# Patient Record
Sex: Female | Born: 1945 | Race: Black or African American | Hispanic: No | Marital: Single | Smoking: Former smoker
Health system: Southern US, Community
[De-identification: ages and names within clinical notes are randomized; demographics above are authoritative.]

## PROBLEM LIST (undated history)

## (undated) DIAGNOSIS — I5040 Unspecified combined systolic (congestive) and diastolic (congestive) heart failure: Secondary | ICD-10-CM

## (undated) DIAGNOSIS — Z950 Presence of cardiac pacemaker: Secondary | ICD-10-CM

## (undated) DIAGNOSIS — K219 Gastro-esophageal reflux disease without esophagitis: Secondary | ICD-10-CM

## (undated) DIAGNOSIS — I10 Essential (primary) hypertension: Secondary | ICD-10-CM

## (undated) DIAGNOSIS — E1121 Type 2 diabetes mellitus with diabetic nephropathy: Secondary | ICD-10-CM

## (undated) DIAGNOSIS — N649 Disorder of breast, unspecified: Secondary | ICD-10-CM

## (undated) DIAGNOSIS — M1990 Unspecified osteoarthritis, unspecified site: Secondary | ICD-10-CM

## (undated) DIAGNOSIS — E039 Hypothyroidism, unspecified: Secondary | ICD-10-CM

## (undated) DIAGNOSIS — H269 Unspecified cataract: Secondary | ICD-10-CM

## (undated) DIAGNOSIS — N2581 Secondary hyperparathyroidism of renal origin: Secondary | ICD-10-CM

## (undated) DIAGNOSIS — I700 Atherosclerosis of aorta: Secondary | ICD-10-CM

## (undated) DIAGNOSIS — I452 Bifascicular block: Secondary | ICD-10-CM

## (undated) DIAGNOSIS — I429 Cardiomyopathy, unspecified: Secondary | ICD-10-CM

## (undated) DIAGNOSIS — E785 Hyperlipidemia, unspecified: Secondary | ICD-10-CM

## (undated) HISTORY — PX: PACEMAKER INSERTION: SHX728

## (undated) HISTORY — DX: Bifascicular block: I45.2

## (undated) HISTORY — PX: TONSILLECTOMY: SUR1361

## (undated) HISTORY — DX: Unspecified cataract: H26.9

## (undated) HISTORY — PX: X-STOP IMPLANTATION: SHX2677

## (undated) HISTORY — DX: Unspecified combined systolic (congestive) and diastolic (congestive) heart failure: I50.40

## (undated) HISTORY — DX: Atherosclerosis of aorta: I70.0

## (undated) HISTORY — PX: CHOLECYSTECTOMY: SHX55

## (undated) HISTORY — DX: Presence of cardiac pacemaker: Z95.0

## (undated) HISTORY — DX: Gastro-esophageal reflux disease without esophagitis: K21.9

## (undated) HISTORY — PX: ABDOMINAL HYSTERECTOMY: SUR658

## (undated) HISTORY — DX: Hyperlipidemia, unspecified: E78.5

## (undated) HISTORY — DX: Secondary hyperparathyroidism of renal origin: N25.81

## (undated) HISTORY — DX: Unspecified osteoarthritis, unspecified site: M19.90

## (undated) HISTORY — DX: Disorder of breast, unspecified: N64.9

## (undated) HISTORY — DX: Cardiomyopathy, unspecified: I42.9

## (undated) HISTORY — DX: Type 2 diabetes mellitus with diabetic nephropathy: E11.21

## (undated) HISTORY — DX: Hypothyroidism, unspecified: E03.9

## (undated) HISTORY — DX: Essential (primary) hypertension: I10

---

## 2018-12-13 ENCOUNTER — Other Ambulatory Visit: Payer: Self-pay | Admitting: Family Medicine

## 2018-12-13 DIAGNOSIS — N6320 Unspecified lump in the left breast, unspecified quadrant: Secondary | ICD-10-CM

## 2019-01-26 ENCOUNTER — Telehealth: Payer: Self-pay

## 2019-01-26 NOTE — Telephone Encounter (Signed)
NOTES FROM DR SUN [phone] REFERRAL  IN PROFICIENT

## 2019-02-23 ENCOUNTER — Telehealth: Payer: Self-pay | Admitting: *Deleted

## 2019-02-23 NOTE — Telephone Encounter (Signed)
Spoke with patient to determine ICD vendor in anticipation of appointment on Friday. Pt describes a Medtronic monitor. She is aware of appointment to establish care with Dr. Caryl Comes on 02/25/19 at 3:30pm. No questions at this time.

## 2019-02-24 ENCOUNTER — Ambulatory Visit: Payer: Self-pay | Admitting: Cardiovascular Disease

## 2019-02-25 ENCOUNTER — Other Ambulatory Visit: Payer: Self-pay

## 2019-02-25 ENCOUNTER — Encounter: Payer: Self-pay | Admitting: Internal Medicine

## 2019-02-25 ENCOUNTER — Ambulatory Visit (INDEPENDENT_AMBULATORY_CARE_PROVIDER_SITE_OTHER): Payer: Medicare Other | Admitting: Internal Medicine

## 2019-02-25 VITALS — BP 126/68 | HR 56 | Ht 64.0 in | Wt 337.0 lb

## 2019-02-25 DIAGNOSIS — I493 Ventricular premature depolarization: Secondary | ICD-10-CM

## 2019-02-25 DIAGNOSIS — I429 Cardiomyopathy, unspecified: Secondary | ICD-10-CM

## 2019-02-25 NOTE — Patient Instructions (Addendum)
Medication Instructions:  Your physician recommends that you continue on your current medications as directed. Please refer to the Current Medication list given to you today.  Labwork: Your physician recommends that you return for lab work in: BMP and CBC   Testing/Procedures: Your physician has requested that you have an echocardiogram. Echocardiography is a painless test that uses sound waves to create images of your heart. It provides your doctor with information about the size and shape of your heart and how well your heart's chambers and valves are working. This procedure takes approximately one hour. There are no restrictions for this procedure.  Your physician has recommended that you wear an event monitor. Event monitors are medical devices that record the heart's electrical activity. Doctors most often [place] these monitors to diagnose arrhythmias. Arrhythmias are problems with the speed or rhythm of the heartbeat. The monitor is a small, portable device. You can wear one while you do your normal daily activities. This is usually used to diagnose what is causing palpitations/syncope (passing out).   Follow-Up: Your physician recommends that you schedule a follow-up appointment in:   December 2 @ 2pm with Dr. Caryl Comes  Any Other Special Instructions Will Be Listed Below (If Applicable).     If you need a refill on your cardiac medications before your next appointment, please call your pharmacy.

## 2019-02-25 NOTE — Progress Notes (Signed)
ELECTROPHYSIOLOGY CONSULT NOTE  Patient ID: Lori Warner, MRN: [id number], DOB/AGE: 71-Jul-1947 71 y.o. Admit date: (Not on file) Date of Consult: 02/25/2019  Primary Physician: Deatra JamesSun, Vyvyan, MD Primary Cardiologist: new  Previously Lori Warner is a 73 y.o. female who is being seen today for the evaluation of ICD and CM at the request of Dr Wynelle LinkSun.   Chief Complaint: ICD   HPI Lori Warner is a 73 y.o. female with a history of nonischemic cardiomyopathy by report for which he underwent ICD implantation for primary prevention about 4 years ago.  She has chronic modest dyspnea.  She is limited to less than 100 yards.  She has treated sleep apnea and does not have orthopnea or nocturnal dyspnea.  She has mild edema.  She has had no syncope.  Her ICD has never gone off.   Past Medical History:  Diagnosis Date  . Aortic atherosclerosis (HCC)   . Arthritis   . Breast lesion   . Cardiomyopathy (HCC)   . Cataracts, bilateral   . Combined congestive systolic and diastolic heart failure (HCC)   . Diabetes mellitus with nephropathy (HCC)   . GERD (gastroesophageal reflux disease)   . Hyperlipidemia   . Hypertension   . Hypothyroidism   . Pacemaker   . Right bundle branch block (RBBB) with left anterior fascicular block (LAFB)   . Secondary hyperparathyroidism [facility](HCC)       Surgical History:  Past Surgical History:  Procedure Laterality Date  . ABDOMINAL HYSTERECTOMY    . CHOLECYSTECTOMY    . PACEMAKER INSERTION    . TONSILLECTOMY    . X-STOP IMPLANTATION       Home Meds: Prior to Admission medications   Medication Sig Start Date End Date Taking? Authorizing Provider  aspirin EC 81 MG tablet Take 81 mg by mouth daily.   Yes [provider]  calcitRIOL (ROCALTROL) 0.25 MCG capsule Take 0.25 mcg by mouth daily.   Yes [provider]  carvedilol (COREG) 25 MG tablet Take 25 mg by mouth 2 (two) times daily with a meal.   Yes [provider]  DULoxetine (CYMBALTA) 60 MG capsule Take 60 mg by mouth daily.   Yes [provider]  famotidine (PEPCID) 20 MG tablet Take 20 mg by mouth 2 (two) times daily.   Yes [provider]  gabapentin (NEURONTIN) 600 MG tablet Take 600 mg by mouth 3 (three) times daily.   Yes [provider]  insulin NPH-regular Human (HUMULIN 70/30) (70-30) 100 UNIT/ML injection Inject 44 Units into the skin daily.    Yes [provider]  levothyroxine (SYNTHROID) 200 MCG tablet Take 200 mcg by mouth daily before breakfast.   Yes [provider]  losartan (COZAAR) 25 MG tablet Take 25 mg by mouth daily.   Yes [provider]  montelukast (SINGULAIR) 10 MG tablet Take 10 mg by mouth at bedtime.   Yes [provider]  Multiple Vitamin (MULTIVITAMIN) tablet Take 1 tablet by mouth daily.   Yes [provider]  torsemide (DEMADEX) 20 MG tablet Take 40 mg by mouth daily.   Yes [provider]      Allergies: No Known Allergies  Social History   Socioeconomic History  . Marital status: Single    Spouse name: Not on file  . Number of children: Not on file  . Years of education: Not on file  . Highest education level: Not on file  Occupational History  .  Not on file  Social Needs  . Financial resource strain: Not on file  . Food insecurity    Worry: Not on file    Inability: Not on file  . Transportation needs    Medical: Not on file    Non-medical: Not on file  Tobacco Use  . Smoking status: Former Smoker    Years: 40.00    Types: Cigarettes  . Smokeless tobacco: Never Used  Substance and Sexual Activity  . Alcohol use: Not on file  . Drug use: Not on file  . Sexual activity: Not Currently  Lifestyle  . Physical activity    Days per week: Not on file    Minutes per session: Not on file  . Stress: Not on file  Relationships  . Social Musician on phone: Not on file    Gets together: Not on  file    Attends religious service: Not on file    Active member of club or organization: Not on file    Attends meetings of clubs or organizations: Not on file    Relationship status: Not on file  . Intimate partner violence    Fear of current or ex partner: Not on file    Emotionally abused: Not on file    Physically abused: Not on file    Forced sexual activity: Not on file  Other Topics Concern  . Not on file  Social History Narrative  . Not on file     Family History  Problem Relation Age of Onset  . Hypertension Mother   . Hyperlipidemia Mother   . Cancer - Lung Mother   . Hypertension Father   . Hyperlipidemia Father   . Hypertension Brother   . Hyperlipidemia Brother   . Cancer - Lung Brother        MELANOMA     ROS:  Please see the history of present illness.     All other systems reviewed and negative.    Physical Exam: Blood pressure 126/68, pulse (!) 56, height 5\' 4"  (1.626 m), weight (!) 337 lb (152.9 kg), SpO2 92 %. General: Well developed, Morbidly obese AA  female in no acute distress. Head: Normocephalic, atraumatic, sclera non-icteric, no xanthomas, nares are without discharge. EENT: normal Lymph Nodes:  none Back: without scoliosis/kyphosis, no CVA tendersness Neck: Negative for carotid bruits. JVD not detectable. Lungs: Clear bilaterally to auscultation without wheezes, rales, or rhonchi. Breathing is unlabored. Heart: RRR with S1 S2. No murmur , rubs, or gallops appreciated. Abdomen: Soft, non-tender, non-distended with normoactive bowel sounds. No hepatomegaly. No rebound/guarding. No obvious abdominal masses. Msk:  Strength and tone appear normal for age. Extremities: No clubbing or cyanosis. tr edema.  Distal pedal pulses are 2+ and equal bilaterally. Skin: Warm and Dry Neuro: Alert and oriented X 3. CN III-XII intact Grossly normal sensory and motor function . Psych:  Responds to questions appropriately with a normal affect.       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  None of the primary data are available.  She underwent ICD implantation for primary prevention.  A monitoring strip shows irregularity of of normal complexes.  This raises the possibility of atrial  fibrillation.  We also note significant PVCs.  We will undertake a 72-hour ZIO patch monitor to quantitate her PVC burden.  We will undertake an echocardiogram to look at left ventricular function.  If it remains depressed, she would be a candidate for the initiation of Aldactone therapy as well as a transition of her losartan to [facility].  Not volume overloaded currently.  We will establish her remotes to be done locally.  We will check a metabolic profile and hemoglobin when she comes in for echocardiogram.  We will also need to get records.  Potentially we can get them from Dr. Lynnda Child office [place]

## 2019-03-01 ENCOUNTER — Ambulatory Visit
Admission: RE | Admit: 2019-03-01 | Discharge: 2019-03-01 | Disposition: A | Discharge status: Home / Self Care | Payer: Medicare Other | Source: Ambulatory Visit | Attending: Family Medicine | Admitting: Family Medicine

## 2019-03-01 ENCOUNTER — Other Ambulatory Visit: Payer: Self-pay | Admitting: Family Medicine

## 2019-03-01 ENCOUNTER — Other Ambulatory Visit: Payer: Self-pay

## 2019-03-01 DIAGNOSIS — N6320 Unspecified lump in the left breast, unspecified quadrant: Secondary | ICD-10-CM

## 2019-03-07 ENCOUNTER — Telehealth: Payer: Self-pay | Admitting: *Deleted

## 2019-03-07 NOTE — Telephone Encounter (Signed)
3 day ZIO XT long term holter monitor to be mailed to the patients home.  Instructions reviewed briefly as they are included in the monitor kit. 

## 2019-03-11 ENCOUNTER — Other Ambulatory Visit: Payer: Self-pay

## 2019-03-11 ENCOUNTER — Ambulatory Visit (INDEPENDENT_AMBULATORY_CARE_PROVIDER_SITE_OTHER): Payer: Medicare Other

## 2019-03-11 ENCOUNTER — Ambulatory Visit (HOSPITAL_COMMUNITY): Payer: Medicare Other | Attending: Cardiovascular Disease

## 2019-03-11 DIAGNOSIS — I5042 Chronic combined systolic (congestive) and diastolic (congestive) heart failure: Secondary | ICD-10-CM | POA: Insufficient documentation

## 2019-03-11 DIAGNOSIS — Z950 Presence of cardiac pacemaker: Secondary | ICD-10-CM | POA: Insufficient documentation

## 2019-03-11 DIAGNOSIS — I493 Ventricular premature depolarization: Secondary | ICD-10-CM

## 2019-03-11 DIAGNOSIS — I429 Cardiomyopathy, unspecified: Secondary | ICD-10-CM | POA: Diagnosis present

## 2019-03-11 DIAGNOSIS — Z87891 Personal history of nicotine dependence: Secondary | ICD-10-CM | POA: Insufficient documentation

## 2019-03-11 DIAGNOSIS — E785 Hyperlipidemia, unspecified: Secondary | ICD-10-CM | POA: Insufficient documentation

## 2019-03-11 DIAGNOSIS — I110 Hypertensive heart disease with heart failure: Secondary | ICD-10-CM | POA: Insufficient documentation

## 2019-03-11 DIAGNOSIS — E119 Type 2 diabetes mellitus without complications: Secondary | ICD-10-CM | POA: Insufficient documentation

## 2019-03-11 NOTE — Addendum Note (Signed)
Addended by: Eulis Foster on: 03/11/2019 12:42 PM   Modules accepted: Orders

## 2019-03-12 LAB — CBC
Hematocrit: 36.2 % (ref 34.0–46.6)
Hemoglobin: 12.4 g/dL (ref 11.1–15.9)
MCH: 28.8 pg (ref 26.6–33.0)
MCHC: 34.3 g/dL (ref 31.5–35.7)
MCV: 84 fL (ref 79–97)
Platelets: 197 10*3/uL (ref 150–450)
RBC: 4.3 x10E6/uL (ref 3.77–5.28)
RDW: 13.5 % (ref 11.7–15.4)
WBC: 6.4 10*3/uL (ref 3.4–10.8)

## 2019-03-12 LAB — BASIC METABOLIC PANEL
BUN/Creatinine Ratio: 17 (ref 12–28)
BUN: 25 mg/dL (ref 8–27)
CO2: 28 mmol/L (ref 20–29)
Calcium: 10.3 mg/dL (ref 8.7–10.3)
Chloride: 97 mmol/L (ref 96–106)
Creatinine, Ser: 1.45 mg/dL — ABNORMAL HIGH (ref 0.57–1.00)
GFR calc Af Amer: 42 mL/min/{1.73_m2} — ABNORMAL LOW (ref >59)
GFR calc non Af Amer: 37 mL/min/{1.73_m2} — ABNORMAL LOW (ref >59)
Glucose: 131 mg/dL — ABNORMAL HIGH (ref 65–99)
Potassium: 4.2 mmol/L (ref 3.5–5.2)
Sodium: 140 mmol/L (ref 134–144)

## 2019-03-22 ENCOUNTER — Telehealth: Payer: Self-pay

## 2019-03-22 MED ORDER — FUROSEMIDE 20 MG PO TABS: 20.0000 mg | ORAL_TABLET | ORAL | 3 refills | Status: DC

## 2019-03-22 NOTE — Telephone Encounter (Signed)
Pt is aware of echo results and recommendations. She states she does have SOB when moving about during her ADL's. She agrees to begin lasix 20mg  qod to see if this helps to relieve her sx. We discussed sodium and fluid management. She states she is sending in her monitor today for Dr. Caryl Comes to review her PVC burden as well. She had no additional questions at this time.

## 2019-03-22 NOTE — Telephone Encounter (Signed)
-----   Message from Deboraha Sprang, MD sent at 03/20/2019  2:45 PM EDT ----- Please Inform Patient Echo showed heart muscle function  EF 45-50%  For now would continue current meds   Lets try lasix 20 mg QOD and see if she gets lets SOB

## 2019-05-26 ENCOUNTER — Encounter: Payer: Medicare Other | Admitting: Internal Medicine

## 2019-07-10 DIAGNOSIS — Z95810 Presence of automatic (implantable) cardiac defibrillator: Secondary | ICD-10-CM

## 2019-07-10 DIAGNOSIS — I509 Heart failure, unspecified: Secondary | ICD-10-CM

## 2019-07-10 DIAGNOSIS — I493 Ventricular premature depolarization: Secondary | ICD-10-CM

## 2019-07-10 DIAGNOSIS — I452 Bifascicular block: Secondary | ICD-10-CM

## 2019-07-10 DIAGNOSIS — I428 Other cardiomyopathies: Secondary | ICD-10-CM

## 2019-07-10 HISTORY — DX: Ventricular premature depolarization: I49.3

## 2019-07-10 HISTORY — DX: Bifascicular block: I45.2

## 2019-07-10 HISTORY — DX: Presence of automatic (implantable) cardiac defibrillator: Z95.810

## 2019-07-10 HISTORY — DX: Heart failure, unspecified: I50.9

## 2019-07-10 HISTORY — DX: Other cardiomyopathies: I42.8

## 2019-07-13 NOTE — Progress Notes (Signed)
Electrophysiology TeleHealth Note   Due to national recommendations of social distancing due to COVID 19, an audio/video telehealth visit is felt to be most appropriate for this patient at this time.  See MyChart message from today for the patient's consent to telehealth for [facility].   Date:  07/14/2019   ID:  Lori Warner, DOB [birth year], MRN [id number]  Location: patient's home  Provider location: [address], [place]  Evaluation Performed: Follow-up visit  PCP:  Donald Prose, MD  Cardiologist:    Electrophysiologist:  SK   Chief Complaint:  ICD   History of Present Illness:    Lori Warner is a 74 y.o. female who presents via audio/video conferencing for a telehealth visit today.  Since last being seen in our clinic with a history of nonischemic cardiomyopathy by report for which he underwent ICD implantation Medtronic for primary prevention about 4 years ago.  DATE TEST EF   9/20 Echo   45-50 %         Date Cr K Hgb  9/20 1.45 4.2 12.4           She has chronic modest dyspnea.  the patient reports breathing about the same.  No edema  CPAP orthopnea of PND.    Wants to get vaccine-- directed   The patient denies symptoms of fevers, chills, cough, or new SOB worrisome for COVID 19. *  Past Medical History:  Diagnosis Date  . Aortic atherosclerosis ([place])   . Arthritis   . Breast lesion   . Cardiomyopathy ([place])   . Cataracts, bilateral   . Combined congestive systolic and diastolic heart failure ([place])   . Diabetes mellitus with nephropathy ([place])   . GERD (gastroesophageal reflux disease)   . Hyperlipidemia   . Hypertension   . Hypothyroidism   . Pacemaker   . Right bundle branch block (RBBB) with left anterior fascicular block (LAFB)   . Secondary hyperparathyroidism [facility])     Past Surgical History:  Procedure Laterality Date  . ABDOMINAL HYSTERECTOMY    . CHOLECYSTECTOMY    . PACEMAKER INSERTION    . TONSILLECTOMY    .  X-STOP IMPLANTATION      Current Outpatient Medications  Medication Sig Dispense Refill  . aspirin EC 81 MG tablet Take 81 mg by mouth daily.    . calcitRIOL (ROCALTROL) 0.25 MCG capsule Take 0.25 mcg by mouth daily.    . carvedilol (COREG) 25 MG tablet Take 25 mg by mouth 2 (two) times daily with a meal.    . DULoxetine (CYMBALTA) 60 MG capsule Take 60 mg by mouth daily.    . famotidine (PEPCID) 20 MG tablet Take 20 mg by mouth 2 (two) times daily.    Marland Kitchen gabapentin (NEURONTIN) 600 MG tablet Take 600 mg by mouth 3 (three) times daily.    . insulin NPH-regular Human (HUMULIN 70/30) (70-30) 100 UNIT/ML injection Inject 44 Units into the skin daily.     Marland Kitchen levothyroxine (SYNTHROID) 200 MCG tablet Take 200 mcg by mouth daily before breakfast.    . losartan (COZAAR) 25 MG tablet Take 25 mg by mouth daily.    . montelukast (SINGULAIR) 10 MG tablet Take 10 mg by mouth at bedtime.    . Multiple Vitamin (MULTIVITAMIN) tablet Take 1 tablet by mouth daily.    Marland Kitchen torsemide (DEMADEX) 20 MG tablet Take 40 mg by mouth daily.    . furosemide (LASIX) 20 MG tablet Take 1 tablet (20  mg total) by mouth every other day. For shortness of breath 45 tablet 3   No current facility-administered medications for this visit.    Allergies:   Patient has no known allergies.   Social History:  The patient  reports that she has quit smoking. Her smoking use included cigarettes. She quit after 40.00 years of use. She has never used smokeless tobacco.   Family History:  The patient's   family history includes Breast cancer in her cousin; Cancer - Lung in her brother and mother; Hyperlipidemia in her brother, father, and mother; Hypertension in her brother, father, and mother.   ROS:  Please see the history of present illness.   All other systems are personally reviewed and negative.    Exam:    Vital Signs:  Ht 5\' 5"  (1.651 m)   Wt (!) 337 lb (152.9 kg)   BMI 56.08 kg/m     Labs/Other Tests and Data Reviewed:     Recent Labs: 03/11/2019: BUN 25; Creatinine, Ser 1.45; Hemoglobin 12.4; Platelets 197; Potassium 4.2; Sodium 140   Wt Readings from Last 3 Encounters:  07/14/19 (!) 337 lb (152.9 kg)  02/25/19 (!) 337 lb (152.9 kg)     Other studies personally reviewed:    Last device remote is reviewed from [employer] PDF dated 9/20 which reveals normal device function,   arrhythmias - none   Event Recorder personnally reviewed--1.3 PVC  ASSESSMENT & PLAN:   Cardiomyopathy dilated nonischemic  Congestive heart failure-chronic-systolic  ICD- Medtronic  PVCs  Morbid obesity  Sleep apnea-treated  Diabetes  DVT-history-remote  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  The importance of social distancing was discussed today.  Follow-up:  26m  Next remote: As Scheduled   Current medicines are reviewed at length with the patient today.   The patient does not have concerns regarding her medicines.  The following changes were made today:  none  Labs/ tests ordered today include:   No orders of the defined types were placed in this encounter.   Future tests ( post COVID )   months  Patient Risk:  after full review of this patients clinical status, I feel that they are at moderate risk at this time.  Today, I have spent 7 minutes with the patient with telehealth technology discussing the above.  Signed, 11m, MD  07/14/2019 3:23 PM     [facility] [address] [place] [phone] (office) [phone] (fax)

## 2019-07-14 ENCOUNTER — Other Ambulatory Visit: Payer: Self-pay

## 2019-07-14 ENCOUNTER — Telehealth (INDEPENDENT_AMBULATORY_CARE_PROVIDER_SITE_OTHER): Payer: Medicare Other | Admitting: Internal Medicine

## 2019-07-14 VITALS — Ht 65.0 in | Wt 337.0 lb

## 2019-07-14 DIAGNOSIS — I5022 Chronic systolic (congestive) heart failure: Secondary | ICD-10-CM | POA: Diagnosis not present

## 2019-07-14 DIAGNOSIS — I452 Bifascicular block: Secondary | ICD-10-CM | POA: Diagnosis not present

## 2019-07-14 DIAGNOSIS — I428 Other cardiomyopathies: Secondary | ICD-10-CM

## 2019-07-14 DIAGNOSIS — Z95810 Presence of automatic (implantable) cardiac defibrillator: Secondary | ICD-10-CM

## 2019-07-14 DIAGNOSIS — I493 Ventricular premature depolarization: Secondary | ICD-10-CM

## 2019-07-14 NOTE — Patient Instructions (Signed)
6 month recall placed per Dr Graciela Husbands request

## 2019-08-09 ENCOUNTER — Ambulatory Visit (INDEPENDENT_AMBULATORY_CARE_PROVIDER_SITE_OTHER): Payer: Medicare Other | Admitting: *Deleted

## 2019-08-09 DIAGNOSIS — Z95810 Presence of automatic (implantable) cardiac defibrillator: Secondary | ICD-10-CM

## 2019-08-09 LAB — CUP PACEART REMOTE DEVICE CHECK
Battery Remaining Longevity: 99 mo
Battery Voltage: 2.99 V
Brady Statistic RV Percent Paced: 0.56 %
Date Time Interrogation Session: 20210216044223
HighPow Impedance: 36 Ohm
HighPow Impedance: 41 Ohm
Lead Channel Impedance Value: 399 Ohm
Lead Channel Impedance Value: 456 Ohm
Lead Channel Pacing Threshold Amplitude: 0.5 V
Lead Channel Pacing Threshold Pulse Width: 0.4 ms
Lead Channel Sensing Intrinsic Amplitude: 4.375 mV
Lead Channel Sensing Intrinsic Amplitude: 4.375 mV
Lead Channel Setting Pacing Amplitude: 2 V
Lead Channel Setting Pacing Pulse Width: 0.4 ms
Lead Channel Setting Sensing Sensitivity: 0.3 mV

## 2019-08-10 NOTE — Progress Notes (Signed)
ICD Remote  

## 2019-09-09 ENCOUNTER — Telehealth: Payer: Self-pay | Admitting: Internal Medicine

## 2019-09-09 NOTE — Telephone Encounter (Signed)
[facility] mail order pharmacy stating that pt is currently on Furosemide 20 mg tablet and has also been prescribed Torsemide 20 mg tablet from Dr. Wynelle Link, which is a possible duplication of therapy. Should the pt be on both medications, they contacted Dr. Chase Caller office and they wanted them to verify with our office instead. Please address

## 2019-09-12 MED ORDER — TORSEMIDE 20 MG PO TABS: 20.0000 mg | ORAL_TABLET | ORAL | 2 refills | Status: DC

## 2019-09-12 NOTE — Telephone Encounter (Signed)
Stop furosemide And take torsemide every other day Thanks SK

## 2019-09-12 NOTE — Telephone Encounter (Signed)
Called Lori Warner to inform her per Dr. Graciela Husbands to stop taking furosemide and just take torsemide every other day. I advised Lori Warner that if she has any other problems, questions or concerns, to give our office a call back. Lori Warner verbalized understanding.

## 2019-10-11 ENCOUNTER — Ambulatory Visit: Payer: Medicare Other | Admitting: Physician Assistant

## 2019-10-12 NOTE — Progress Notes (Signed)
Cardiology Office Note Date:  10/13/2019  Patient ID:  Lori Warner, DOB 1946/06/07, MRN [id number] PCP:  Donald Prose, MD  Electrophysiologist Dr. Genelle Gather from [place], established w/[facility] Aug 2020    Chief Complaint: edema  History of Present Illness: Lori Warner is a 74 y.o. female with history of DM, HTN, HLD, GERD, hyperparathyroidism, RBBB, NICM, chronic CHF (combined), ICD, OSA w/CPAP, remote DVT, morbid obesity, PVCs  She comes in today to be seen for Dr. Caryl Comes, last seen by him via tele health visit Jan 2021.  She was doing well, not suspect to be volume OL.    She is accompanied by her grandson, comes in a wheelchair today.  She reports that she spoke to her PMD about LE swelling and was recommended to be seen here. She tells me she walks in her home with a cane or walker, she has longstanding baseline DOE that is uncghanged. She has no rest SOB, she sleeps with one pillow in bed, and CPAP and sleeps well.   No dizzy spells, near syncope or syncope  She reports about a month of LE swelling that is gone in the Am and accumulates through the day.  Historically has had some intermittent, more generalized LE swelling, though this seems different and more like pockets/areas of swelling, the L more then the right.  She also mentions she has ankle pain that radiates to lat knee on the right.  No calf pain. She spends most of her time seated.  Device information MDT single chamber ICD, implanted 01/22/2015   Past Medical History:  Diagnosis Date  . Aortic atherosclerosis ([place])   . Arthritis   . Breast lesion   . Cardiomyopathy ([place])   . Cataracts, bilateral   . Combined congestive systolic and diastolic heart failure ([place])   . Diabetes mellitus with nephropathy ([place])   . GERD (gastroesophageal reflux disease)   . Hyperlipidemia   . Hypertension   . Hypothyroidism   . Pacemaker   . Right bundle branch block (RBBB) with left anterior fascicular block (LAFB)   .  Secondary hyperparathyroidism [facility])     Past Surgical History:  Procedure Laterality Date  . ABDOMINAL HYSTERECTOMY    . CHOLECYSTECTOMY    . PACEMAKER INSERTION    . TONSILLECTOMY    . X-STOP IMPLANTATION      Current Outpatient Medications  Medication Sig Dispense Refill  . aspirin EC 81 MG tablet Take 81 mg by mouth daily.    . calcitRIOL (ROCALTROL) 0.25 MCG capsule Take 0.25 mcg by mouth daily.    . carvedilol (COREG) 25 MG tablet Take 25 mg by mouth 2 (two) times daily with a meal.    . DULoxetine (CYMBALTA) 60 MG capsule Take 60 mg by mouth daily.    . famotidine (PEPCID) 20 MG tablet Take 20 mg by mouth 2 (two) times daily.    Marland Kitchen gabapentin (NEURONTIN) 600 MG tablet Take 600 mg by mouth 3 (three) times daily.    . insulin NPH-regular Human (HUMULIN 70/30) (70-30) 100 UNIT/ML injection Inject 44 Units into the skin daily.     Marland Kitchen levothyroxine (SYNTHROID) 200 MCG tablet Take 200 mcg by mouth daily before breakfast.    . losartan (COZAAR) 25 MG tablet Take 25 mg by mouth daily.    . montelukast (SINGULAIR) 10 MG tablet Take 10 mg by mouth at bedtime.    . Multiple Vitamin (MULTIVITAMIN) tablet Take 1 tablet by mouth daily.    Marland Kitchen torsemide (DEMADEX)  20 MG tablet Take 1 tablet (20 mg total) by mouth every other day. 45 tablet 2   No current facility-administered medications for this visit.    Allergies:   Patient has no known allergies.   Social History:  The patient  reports that she has quit smoking. Her smoking use included cigarettes. She quit after 40.00 years of use. She has never used smokeless tobacco.   Family History:  The patient's family history includes Breast cancer in her cousin; Cancer - Lung in her brother and mother; Hyperlipidemia in her brother, father, and mother; Hypertension in her brother, father, and mother.  ROS:  Please see the history of present illness. All other systems are reviewed and otherwise negative.   PHYSICAL EXAM:  VS:  BP 128/68   Pulse  63   Ht 5\' 5"  (1.651 m)   Wt (!) 345 lb (156.5 kg)   BMI 57.41 kg/m  BMI: Body mass index is 57.41 kg/m. Well nourished, well developed, in no acute distress  HEENT: normocephalic, atraumatic  Neck: no JVD, carotid bruits or masses Cardiac:   RRR; no significant murmurs, no rubs, or gallops Lungs: CTA b/l, no wheezing, rhonchi or rales  Abd: soft, nontender, obese MS: no deformity or atrophy Ext: she has trace if any pitting edema RLE, trace-1+ on the right, some mild chronic looking sklin changes, no masses or calf tenderness, no erythema is noted.  She is super morbidly obese and this very much limits ability to assess for volume  Skin: warm and dry, no rash Neuro:  No gross deficits appreciated Psych: euthymic mood, full affect  ICD site is stable, no tethering or discomfort   EKG:  Done today and reviewed by myself shows  SR 63, RBBB, LAD  ICD interrogation done today and reviewed by myself;  Battery and lead measurements are good no arrhythmias OptiVol is well below threshold VS 99.6%   Oct 2020:3 day monitor Findings PVC 1.3% Conclusion not enough PVCs to be responsible for cardiomyopathy      03/11/2019: TTE IMPRESSIONS  1. Left ventricular ejection fraction, by visual estimation, is 45 to  50%. The left ventricle has normal function. Normal left ventricular size.  Left ventricular septal wall thickness was mildly increased. There is  mildly increased left ventricular  hypertrophy.  2. Left ventricular diastolic Doppler parameters are consistent with  impaired relaxation pattern of LV diastolic filling.  3. Abnormal septal motion.  4. Global right ventricle has normal systolic function.The right  ventricular size is normal. No increase in right ventricular wall  thickness.  5. Pacing wires in RA/RV.  6. Left atrial size was normal.  7. Right atrial size was normal.  8. Moderate calcification of the mitral valve leaflet(s).  9. Moderate thickening of  the mitral valve leaflet(s).  10. The mitral valve is normal in structure. No evidence of mitral valve  regurgitation. No evidence of mitral stenosis.  11. Moderate posteriior annular calcification.  12. The tricuspid valve is normal in structure. Tricuspid valve  regurgitation was not visualized by color flow Doppler.  13. The aortic valve is normal in structure. Aortic valve regurgitation  was not visualized by color flow Doppler. Structurally normal aortic  valve, with no evidence of sclerosis or stenosis.  14. The pulmonic valve was normal in structure. Pulmonic valve  regurgitation is not visualized by color flow Doppler.  15. The inferior vena cava is normal in size with greater than 50%  respiratory variability, suggesting right atrial pressure  of 3 mmHg.     Recent Labs: 03/11/2019: BUN 25; Creatinine, Ser 1.45; Hemoglobin 12.4; Platelets 197; Potassium 4.2; Sodium 140  No results found for requested labs within last 8760 hours.   CrCl cannot be calculated (Patient's most recent lab result is older than the maximum 21 days allowed.).   Wt Readings from Last 3 Encounters:  10/13/19 (!) 345 lb (156.5 kg)  07/14/19 (!) 337 lb (152.9 kg)  02/25/19 (!) 337 lb (152.9 kg)     Other studies reviewed: Additional studies/records reviewed today include: summarized above  ASSESSMENT AND PLAN:  1. ICD     Intact function, no programming changes made  2. NICM 3. Chronic CHF (combined)     Her body habitus makes volume assessment challenging though I do not appreciate any significant edema, pockets of swelling, her OptiVol does not suggest volume OL      She describes dependent edema and suspect more venous insufficiency, is somewhat asymmetrical will check venous US       No change for now on her torsemide, she had labs a week or so ago via her PMD, will request the results       I do not think she can wear support stockings She is instructed to elevate her legs when seated.  3.  HTN     Looks good, no changes  4. HLD     Not addressed today    Disposition: F/u with [place] results, and 59mo, sooner if needed   Current medicines are reviewed at length with the patient today.  The patient did not have any concerns regarding medicines.  Norma Fredrickson, PA-C 10/13/2019 3:46 PM     [facility] [address] [place] [address] [phone] (office)  [phone] (fax)

## 2019-10-13 ENCOUNTER — Ambulatory Visit: Payer: Medicare Other | Admitting: Physician Assistant

## 2019-10-13 ENCOUNTER — Other Ambulatory Visit: Payer: Self-pay

## 2019-10-13 VITALS — BP 128/68 | HR 63 | Ht 65.0 in | Wt 345.0 lb

## 2019-10-13 DIAGNOSIS — I5022 Chronic systolic (congestive) heart failure: Secondary | ICD-10-CM | POA: Diagnosis not present

## 2019-10-13 DIAGNOSIS — I10 Essential (primary) hypertension: Secondary | ICD-10-CM | POA: Diagnosis not present

## 2019-10-13 DIAGNOSIS — R609 Edema, unspecified: Secondary | ICD-10-CM

## 2019-10-13 DIAGNOSIS — Z95810 Presence of automatic (implantable) cardiac defibrillator: Secondary | ICD-10-CM

## 2019-10-13 DIAGNOSIS — I5042 Chronic combined systolic (congestive) and diastolic (congestive) heart failure: Secondary | ICD-10-CM

## 2019-10-13 NOTE — Patient Instructions (Signed)
Medication Instructions:  Your physician recommends that you continue on your current medications as directed. Please refer to the Current Medication list given to you today.  *If you need a refill on your cardiac medications before your next appointment, please call your pharmacy*   Lab Work: NONE ORDERED  TODAY  If you have labs (blood work) drawn today and your tests are completely normal, you will receive your results only by: Marland Kitchen MyChart Message (if you have MyChart) OR . A paper copy in the mail If you have any lab test that is abnormal or we need to change your treatment, we will call you to review the results.   Testing/Procedures: Your physician has requested that you have a lower extremity arterial exercise duplex. During this test, exercise and ultrasound are used to evaluate arterial blood flow in the legs. Allow one hour for this exam. There are no restrictions or special instructions.   Follow-Up: At [facility], you and your health needs are our priority.  As part of our continuing mission to provide you with exceptional heart care, we have created designated Provider Care Teams.  These Care Teams include your primary Cardiologist (physician) and Advanced Practice Providers (APPs -  Physician Assistants and Nurse Practitioners) who all work together to provide you with the care you need, when you need it.  We recommend signing up for the patient portal called "MyChart".  Sign up information is provided on this After Visit Summary.  MyChart is used to connect with patients for Virtual Visits (Telemedicine).  Patients are able to view lab/test results, encounter notes, upcoming appointments, etc.  Non-urgent messages can be sent to your provider as well.   To learn more about what you can do with MyChart, go to [email or web address].    Your next appointment:   1 month(s)  The format for your next appointment:   In Person  Provider:  You may see Francis Dowse, PA-C  Other  Instructions

## 2019-10-14 ENCOUNTER — Ambulatory Visit (HOSPITAL_COMMUNITY)
Admission: RE | Admit: 2019-10-14 | Discharge: 2019-10-14 | Disposition: A | Discharge status: Home / Self Care | Payer: Medicare Other | Source: Ambulatory Visit | Attending: Cardiovascular Disease | Admitting: Cardiovascular Disease

## 2019-10-14 DIAGNOSIS — R609 Edema, unspecified: Secondary | ICD-10-CM | POA: Diagnosis present

## 2019-10-14 DIAGNOSIS — R600 Localized edema: Secondary | ICD-10-CM

## 2019-11-08 ENCOUNTER — Ambulatory Visit (INDEPENDENT_AMBULATORY_CARE_PROVIDER_SITE_OTHER): Payer: Medicare Other | Admitting: *Deleted

## 2019-11-08 DIAGNOSIS — I428 Other cardiomyopathies: Secondary | ICD-10-CM | POA: Diagnosis not present

## 2019-11-08 DIAGNOSIS — I5042 Chronic combined systolic (congestive) and diastolic (congestive) heart failure: Secondary | ICD-10-CM

## 2019-11-09 ENCOUNTER — Telehealth: Payer: Self-pay

## 2019-11-09 LAB — CUP PACEART REMOTE DEVICE CHECK
Battery Remaining Longevity: 95 mo
Battery Voltage: 2.99 V
Brady Statistic RV Percent Paced: 0.41 %
Date Time Interrogation Session: 20210519103616
HighPow Impedance: 39 Ohm
HighPow Impedance: 47 Ohm
Lead Channel Impedance Value: 475 Ohm
Lead Channel Impedance Value: 532 Ohm
Lead Channel Pacing Threshold Amplitude: 0.625 V
Lead Channel Pacing Threshold Pulse Width: 0.4 ms
Lead Channel Sensing Intrinsic Amplitude: 4.625 mV
Lead Channel Sensing Intrinsic Amplitude: 4.625 mV
Lead Channel Setting Pacing Amplitude: 2 V
Lead Channel Setting Pacing Pulse Width: 0.4 ms
Lead Channel Setting Sensing Sensitivity: 0.3 mV

## 2019-11-09 NOTE — Telephone Encounter (Signed)
Spoke with patient to remind of missed remote transmission 

## 2019-11-10 NOTE — Progress Notes (Signed)
Remote ICD transmission.   

## 2019-11-15 ENCOUNTER — Encounter: Payer: Medicare Other | Admitting: Physician Assistant

## 2020-01-10 ENCOUNTER — Other Ambulatory Visit: Payer: Self-pay

## 2020-01-10 ENCOUNTER — Encounter: Payer: Self-pay | Admitting: Internal Medicine

## 2020-01-10 ENCOUNTER — Ambulatory Visit: Payer: Medicare Other | Admitting: Internal Medicine

## 2020-01-10 VITALS — BP 132/70 | HR 64 | Ht 64.0 in | Wt 344.8 lb

## 2020-01-10 DIAGNOSIS — I428 Other cardiomyopathies: Secondary | ICD-10-CM

## 2020-01-10 DIAGNOSIS — I509 Heart failure, unspecified: Secondary | ICD-10-CM

## 2020-01-10 DIAGNOSIS — Z95810 Presence of automatic (implantable) cardiac defibrillator: Secondary | ICD-10-CM

## 2020-01-10 NOTE — Patient Instructions (Signed)
Medication Instructions:  Your physician has recommended you make the following change in your medication:   **  Increase your Torsemide to 40mg  (2 tablets) every other day for 3 doses.   Labwork: None ordered.  Testing/Procedures: None ordered.  Follow-Up: Your physician wants you to follow-up in: 12 months with Dr . You will receive a reminder letter in the mail two months in advance. If you don't receive a letter, please call our office to schedule the follow-up appointment.  Remote monitoring is used to monitor your Pacemaker of ICD from home. This monitoring reduces the number of office visits required to check your device to one time per year. It allows Graciela Husbands to keep an eye on the functioning of your device to ensure it is working properly.   Any Other Special Instructions Will Be Listed Below (If Applicable).  If you need a refill on your cardiac medications before your next appointment, please call your pharmacy.

## 2020-01-10 NOTE — Progress Notes (Signed)
Patient Care Team: Deatra James, MD as PCP - General (Family Medicine)   HPI  Lori Warner is a 74 y.o. female seen in follow-up for an ICD implanted for primary prevention in the setting of nonischemic heart disease April 2016.  (Not here)  No interval ICD discharges.  Some dizziness today.  Has lasted on and off for a couple of hours.  Somewhat improved with sitting but not relieved that way.  No other neurological symptoms.  Chronic dyspnea.  Mild peripheral edema. DATE TEST EF   9/20 Echo   45-50 %         Date Cr K Hgb  9/20 1.45 4.2 12.4   4/21 1.37 4.7        Records and Results Reviewed   Past Medical History:  Diagnosis Date  . Aortic atherosclerosis ([facility])   . Arthritis   . Breast lesion   . Cardiomyopathy ([facility])   . Cataracts, bilateral   . CHF (congestive heart failure) ([facility]) 07/10/2019  . Combined congestive systolic and diastolic heart failure ([facility])   . Diabetes mellitus with nephropathy ([facility])   . GERD (gastroesophageal reflux disease)   . Hyperlipidemia   . Hypertension   . Hypothyroidism   . ICD (implantable cardioverter-defibrillator) in place 07/10/2019  . NICM (nonischemic cardiomyopathy) ([facility]) 07/10/2019  . Pacemaker   . PVC (premature ventricular contraction) 07/10/2019  . RBBB (right bundle branch block with left anterior fascicular block) 07/10/2019  . Right bundle branch block (RBBB) with left anterior fascicular block (LAFB)   . Secondary hyperparathyroidism [facility])     Past Surgical History:  Procedure Laterality Date  . ABDOMINAL HYSTERECTOMY    . CHOLECYSTECTOMY    . PACEMAKER INSERTION    . TONSILLECTOMY    . X-STOP IMPLANTATION      Current Meds  Medication Sig  . aspirin EC 81 MG tablet Take 81 mg by mouth daily.  . calcitRIOL (ROCALTROL) 0.25 MCG capsule Take 0.25 mcg by mouth daily.  . carvedilol (COREG) 25 MG tablet Take 25 mg by mouth 2 (two) times daily with a meal.  . DULoxetine (CYMBALTA) 60 MG capsule  Take 60 mg by mouth daily.  . famotidine (PEPCID) 20 MG tablet Take 20 mg by mouth 2 (two) times daily.  Marland Kitchen gabapentin (NEURONTIN) 600 MG tablet Take 600 mg by mouth 3 (three) times daily.  . insulin NPH-regular Human (HUMULIN 70/30) (70-30) 100 UNIT/ML injection Inject 44 Units into the skin daily.   Marland Kitchen levothyroxine (SYNTHROID) 200 MCG tablet Take 200 mcg by mouth daily before breakfast.  . losartan (COZAAR) 25 MG tablet Take 25 mg by mouth daily.  . montelukast (SINGULAIR) 10 MG tablet Take 10 mg by mouth at bedtime.  . Multiple Vitamin (MULTIVITAMIN) tablet Take 1 tablet by mouth daily.  Marland Kitchen torsemide (DEMADEX) 20 MG tablet Take 1 tablet (20 mg total) by mouth every other day.    No Known Allergies    Review of Systems negative except from HPI and PMH  Physical Exam BP 132/70   Pulse 64   Ht 5\' 4"  (1.626 m)   Wt (!) 344 lb 12.8 oz (156.4 kg)   SpO2 95%   BMI 59.18 kg/m  Well developed and well nourished in no acute distress HENT normal E scleral and icterus clear Neck Supple JVP flat; carotids brisk and full Clear to ausculation  Regular rate and rhythm, no murmurs gallops or rub Soft with active bowel sounds No clubbing cyanosis  1+Edema Alert and oriented, grossly normal motor and sensory function F-N normal on the right but difficulty on the left.  Hand Skin Warm and Dry  ECG sinus RBBB  15/17/46 Axis -39      Assessment and  Plan  Cardiomyopathy dilated nonischemic  Congestive heart failure-chronic-systolic  ICD- [employer]  PVCs  Morbid obesity  Sleep apnea-treated  Diabetes  DVT-history-remote  Right bundle branch left anterior fascicular block\  Dizziness    Dizziness abrupt onset today relieved by sitting.  There is a little bit of cerebellar issue with pointing of her left hand not seen in other (rudimentary neurological) testing   given that the symptoms have resolved largely, we will not sent to the emergency room.  Have stressed  that if she has recurrent neurological symptoms she should go to the emergency room  Device is quiet.  Mildly volume overloaded.  We will increase her Demadex from 20 mg every other day to 40 mg every other day for 3 doses.     Current medicines are reviewed at length with the patient today .  The patient does not  have concerns regarding medicines.

## 2020-02-07 ENCOUNTER — Ambulatory Visit (INDEPENDENT_AMBULATORY_CARE_PROVIDER_SITE_OTHER): Payer: Medicare Other | Admitting: *Deleted

## 2020-02-07 DIAGNOSIS — I428 Other cardiomyopathies: Secondary | ICD-10-CM | POA: Diagnosis not present

## 2020-02-07 LAB — CUP PACEART REMOTE DEVICE CHECK
Battery Remaining Longevity: 91 mo
Battery Voltage: 2.98 V
Brady Statistic RV Percent Paced: 0.33 %
Date Time Interrogation Session: 20210817043723
HighPow Impedance: 38 Ohm
HighPow Impedance: 46 Ohm
Lead Channel Impedance Value: 399 Ohm
Lead Channel Impedance Value: 475 Ohm
Lead Channel Pacing Threshold Amplitude: 0.5 V
Lead Channel Pacing Threshold Pulse Width: 0.4 ms
Lead Channel Sensing Intrinsic Amplitude: 4.375 mV
Lead Channel Sensing Intrinsic Amplitude: 4.375 mV
Lead Channel Setting Pacing Amplitude: 2 V
Lead Channel Setting Pacing Pulse Width: 0.4 ms
Lead Channel Setting Sensing Sensitivity: 0.3 mV

## 2020-02-09 NOTE — Progress Notes (Signed)
Remote ICD transmission.   

## 2020-02-24 ENCOUNTER — Other Ambulatory Visit: Payer: Self-pay | Admitting: Internal Medicine

## 2020-02-24 IMAGING — MG MM DIGITAL DIAGNOSTIC BILAT W/ TOMO W/ CAD
6 of 12 series · 6 of 36 positions shown · non-contrast
Comparison: Previous outside institution exam(s).

CLINICAL DATA: 72-year-old female here for 2 year follow-up of
probably benign masses in the left breast as well as annual
mammogram.

EXAM:
DIGITAL DIAGNOSTIC BILATERAL MAMMOGRAM WITH TOMO
ULTRASOUND LEFT BREAST

[R CC synth-2D]
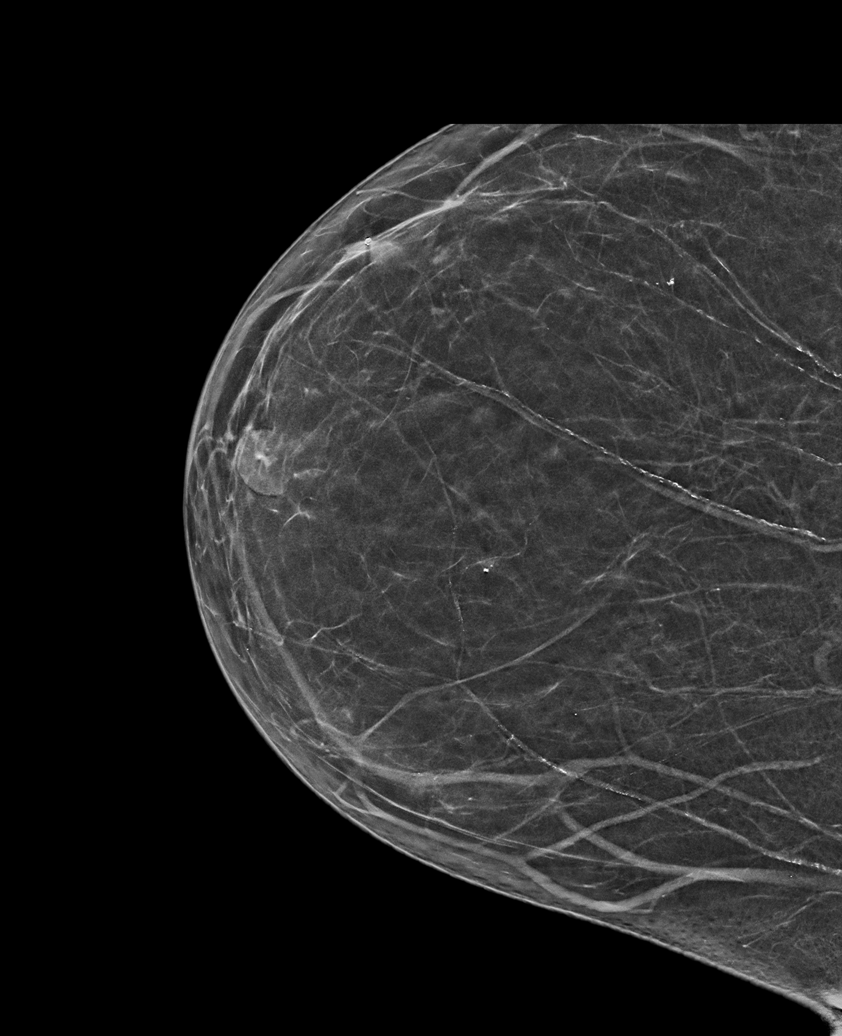

[R MLO synth-2D]
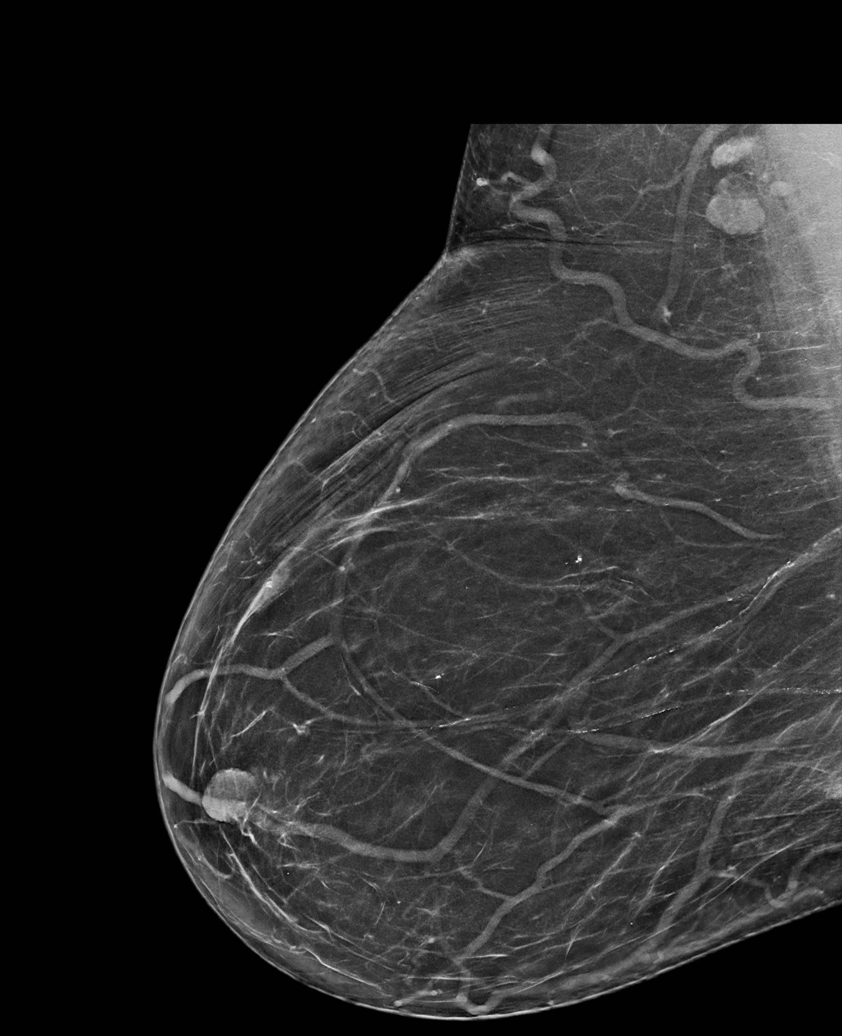

[L CC synth-2D]
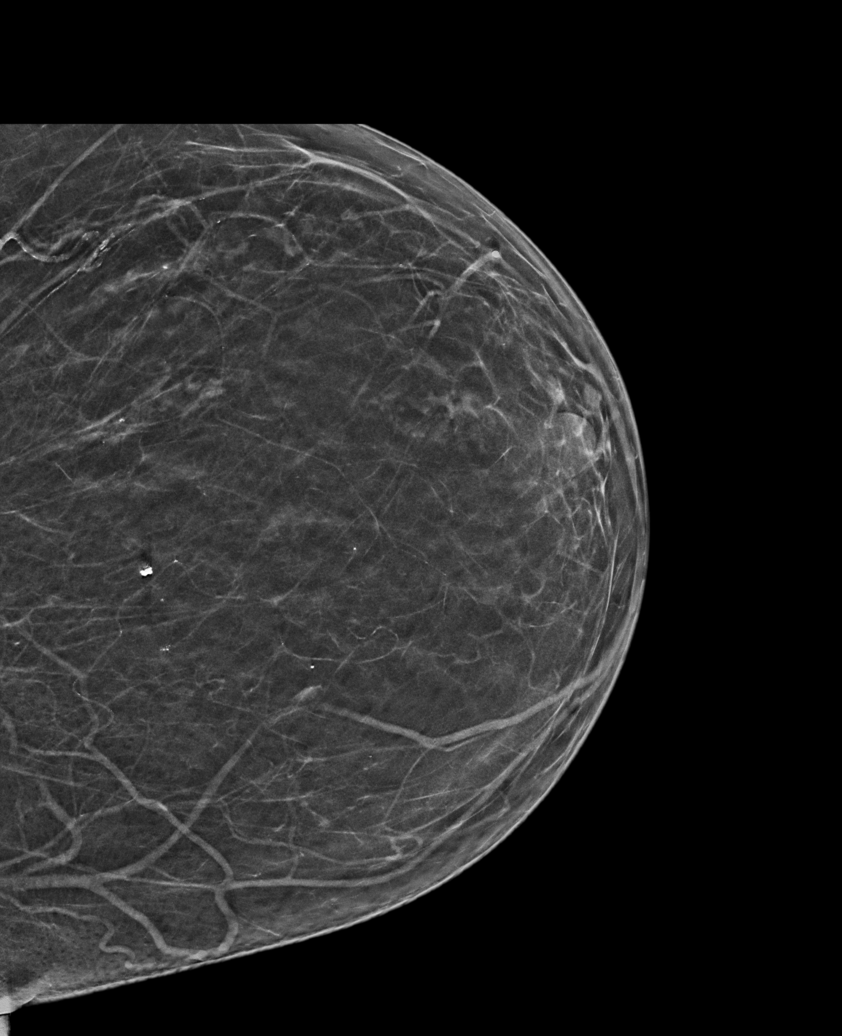

[R ML synth-2D]
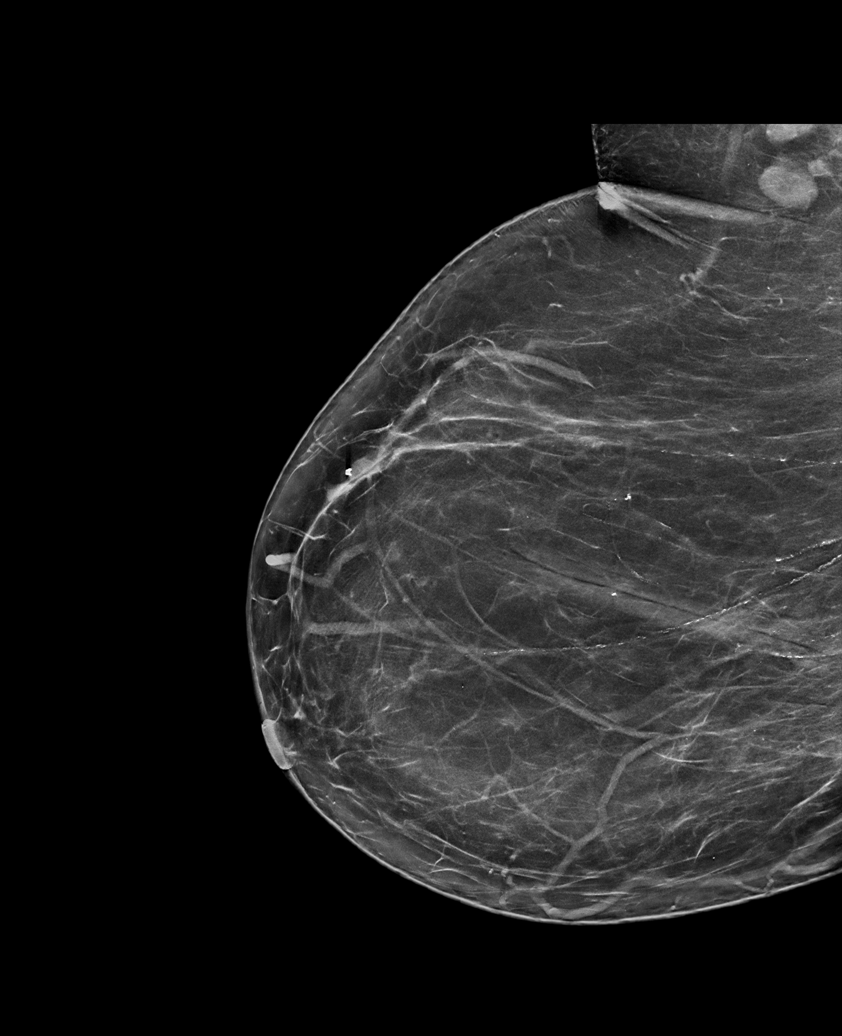

[L MLO synth-2D (1 of 2)]
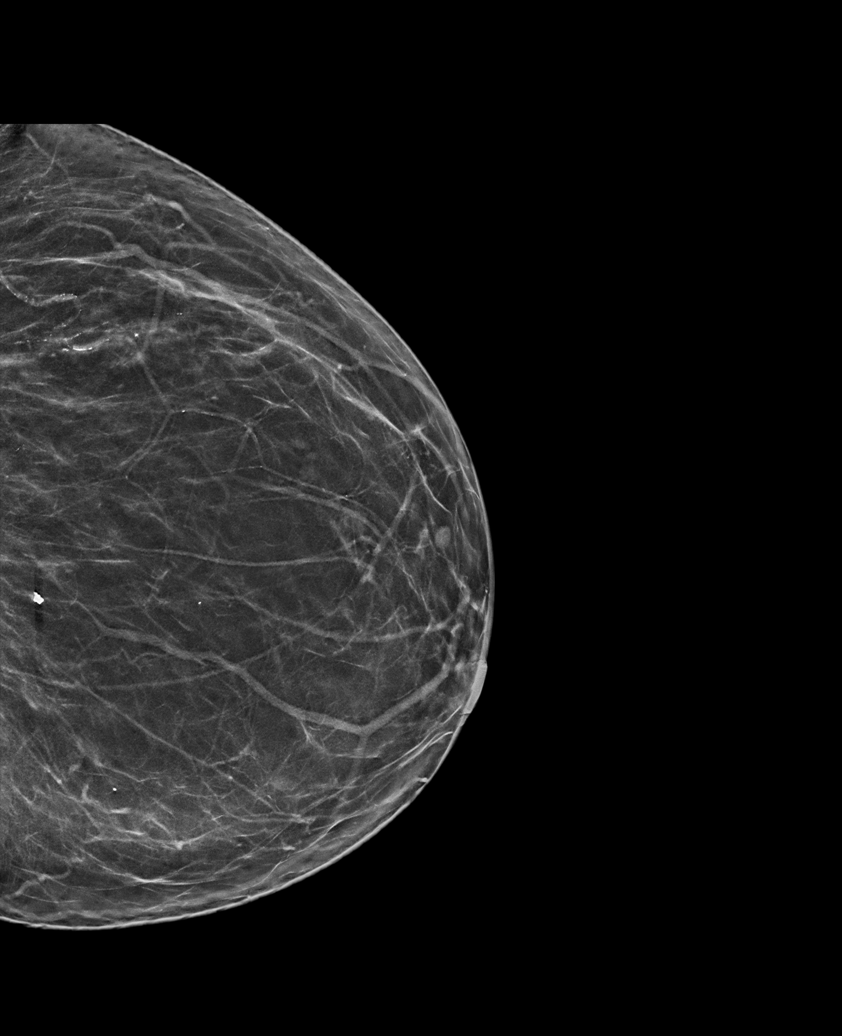

[L MLO synth-2D (2 of 2)]
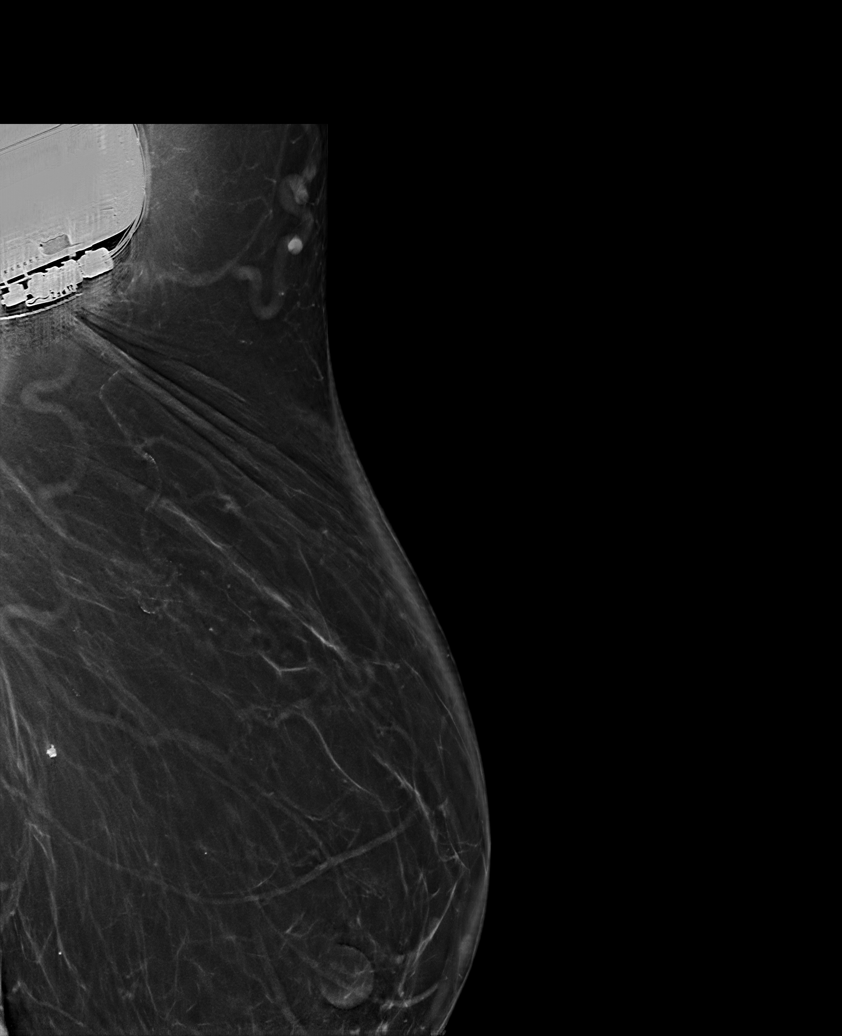

[6 of 36 positions shown; findings below may reference images not displayed]

ACR Breast Density Category b: There are scattered areas of
fibroglandular density.
FINDINGS: No suspicious mass, microcalcification, or other finding is
identified in either breast.

Targeted left breast ultrasound was performed.

At 12 o'clock 5 cm from the nipple an oval circumscribed hyperechoic
mass measures 5 x 3 x 5 mm. At 12 o'clock 3 cm from the nipple an
oval circumscribed hyperechoic mass measures 6 x 3 x 6 mm. These
masses have been previously labeled as the 12 o'clock at 3 cm, 4 cm,
and 5 cm from the nipple on prior studies dating back to 07/14/2016.
The masses do not appear to have significantly changed for greater
than 2 years given the discrepancies in the locations of these
masses described on prior exams. These masses likely reflect areas
of fat necrosis.
IMPRESSION: Hyperechoic masses in the left breast are not significantly changed
for greater than 2 years and therefore benign. No evidence of
malignancy in either breast.

RECOMMENDATION:
Recommend routine annual screening mammogram in 1 year.

I have discussed the findings and recommendations with the patient.
If applicable, a reminder letter will be sent to the patient
regarding the next appointment.

BI-RADS CATEGORY  2: Benign.

## 2020-04-10 ENCOUNTER — Other Ambulatory Visit: Payer: Self-pay | Admitting: Family Medicine

## 2020-04-10 DIAGNOSIS — Z1231 Encounter for screening mammogram for malignant neoplasm of breast: Secondary | ICD-10-CM

## 2020-04-10 DIAGNOSIS — E2839 Other primary ovarian failure: Secondary | ICD-10-CM

## 2020-05-08 ENCOUNTER — Ambulatory Visit (INDEPENDENT_AMBULATORY_CARE_PROVIDER_SITE_OTHER): Payer: Medicare Other

## 2020-05-08 DIAGNOSIS — I428 Other cardiomyopathies: Secondary | ICD-10-CM

## 2020-05-10 LAB — CUP PACEART REMOTE DEVICE CHECK
Battery Remaining Longevity: 88 mo
Battery Voltage: 2.99 V
Brady Statistic RV Percent Paced: 0.21 %
Date Time Interrogation Session: 20211118022723
HighPow Impedance: 43 Ohm
HighPow Impedance: 52 Ohm
Implantable Pulse Generator Implant Date: 20160801
Lead Channel Impedance Value: 513 Ohm
Lead Channel Impedance Value: 608 Ohm
Lead Channel Pacing Threshold Amplitude: 0.5 V
Lead Channel Pacing Threshold Pulse Width: 0.4 ms
Lead Channel Sensing Intrinsic Amplitude: 4.5 mV
Lead Channel Sensing Intrinsic Amplitude: 4.5 mV
Lead Channel Setting Pacing Amplitude: 2 V
Lead Channel Setting Pacing Pulse Width: 0.4 ms
Lead Channel Setting Sensing Sensitivity: 0.3 mV

## 2020-05-10 NOTE — Progress Notes (Signed)
Remote ICD transmission.   

## 2020-07-30 ENCOUNTER — Ambulatory Visit: Payer: Medicare Other

## 2020-07-30 ENCOUNTER — Other Ambulatory Visit: Payer: Medicare Other

## 2020-08-09 ENCOUNTER — Ambulatory Visit (INDEPENDENT_AMBULATORY_CARE_PROVIDER_SITE_OTHER): Payer: Medicare (Managed Care)

## 2020-08-09 DIAGNOSIS — I428 Other cardiomyopathies: Secondary | ICD-10-CM | POA: Diagnosis not present

## 2020-08-10 LAB — CUP PACEART REMOTE DEVICE CHECK
Battery Remaining Longevity: 85 mo
Battery Voltage: 2.98 V
Brady Statistic RV Percent Paced: 0.06 %
Date Time Interrogation Session: 20220218033324
HighPow Impedance: 38 Ohm
HighPow Impedance: 46 Ohm
Implantable Pulse Generator Implant Date: 20160801
Lead Channel Impedance Value: 361 Ohm
Lead Channel Impedance Value: 456 Ohm
Lead Channel Pacing Threshold Amplitude: 0.5 V
Lead Channel Pacing Threshold Pulse Width: 0.4 ms
Lead Channel Sensing Intrinsic Amplitude: 4.375 mV
Lead Channel Sensing Intrinsic Amplitude: 4.375 mV
Lead Channel Setting Pacing Amplitude: 2 V
Lead Channel Setting Pacing Pulse Width: 0.4 ms
Lead Channel Setting Sensing Sensitivity: 0.3 mV

## 2020-08-16 NOTE — Progress Notes (Signed)
Remote ICD transmission.
